# Patient Record
Sex: Female | Born: 1955 | Race: White | Hispanic: No | Marital: Married | Smoking: Current every day smoker
Health system: Southern US, Community
[De-identification: ages and names within clinical notes are randomized; demographics above are authoritative.]

## PROBLEM LIST (undated history)

## (undated) DIAGNOSIS — R51 Headache: Secondary | ICD-10-CM

## (undated) DIAGNOSIS — Z9889 Other specified postprocedural states: Secondary | ICD-10-CM

## (undated) DIAGNOSIS — Z972 Presence of dental prosthetic device (complete) (partial): Secondary | ICD-10-CM

## (undated) DIAGNOSIS — M549 Dorsalgia, unspecified: Secondary | ICD-10-CM

## (undated) DIAGNOSIS — K219 Gastro-esophageal reflux disease without esophagitis: Secondary | ICD-10-CM

## (undated) DIAGNOSIS — R519 Headache, unspecified: Secondary | ICD-10-CM

## (undated) DIAGNOSIS — F329 Major depressive disorder, single episode, unspecified: Secondary | ICD-10-CM

## (undated) DIAGNOSIS — F419 Anxiety disorder, unspecified: Secondary | ICD-10-CM

## (undated) DIAGNOSIS — G8929 Other chronic pain: Secondary | ICD-10-CM

## (undated) DIAGNOSIS — K5909 Other constipation: Secondary | ICD-10-CM

## (undated) DIAGNOSIS — T8859XA Other complications of anesthesia, initial encounter: Secondary | ICD-10-CM

## (undated) DIAGNOSIS — M1990 Unspecified osteoarthritis, unspecified site: Secondary | ICD-10-CM

## (undated) DIAGNOSIS — K449 Diaphragmatic hernia without obstruction or gangrene: Secondary | ICD-10-CM

## (undated) DIAGNOSIS — D4860 Neoplasm of uncertain behavior of unspecified breast: Secondary | ICD-10-CM

## (undated) DIAGNOSIS — T4145XA Adverse effect of unspecified anesthetic, initial encounter: Secondary | ICD-10-CM

## (undated) DIAGNOSIS — K08109 Complete loss of teeth, unspecified cause, unspecified class: Secondary | ICD-10-CM

## (undated) DIAGNOSIS — R920 Mammographic microcalcification found on diagnostic imaging of breast: Secondary | ICD-10-CM

## (undated) DIAGNOSIS — M5136 Other intervertebral disc degeneration, lumbar region: Secondary | ICD-10-CM

## (undated) DIAGNOSIS — K469 Unspecified abdominal hernia without obstruction or gangrene: Secondary | ICD-10-CM

## (undated) DIAGNOSIS — M51369 Other intervertebral disc degeneration, lumbar region without mention of lumbar back pain or lower extremity pain: Secondary | ICD-10-CM

## (undated) DIAGNOSIS — R112 Nausea with vomiting, unspecified: Secondary | ICD-10-CM

## (undated) DIAGNOSIS — M5020 Other cervical disc displacement, unspecified cervical region: Secondary | ICD-10-CM

## (undated) HISTORY — DX: Mammographic microcalcification found on diagnostic imaging of breast: R92.0

## (undated) HISTORY — DX: Dorsalgia, unspecified: M54.9

## (undated) HISTORY — DX: Gastro-esophageal reflux disease without esophagitis: K21.9

## (undated) HISTORY — DX: Other cervical disc displacement, unspecified cervical region: M50.20

## (undated) HISTORY — DX: Other constipation: K59.09

## (undated) HISTORY — DX: Unspecified abdominal hernia without obstruction or gangrene: K46.9

## (undated) HISTORY — DX: Other chronic pain: G89.29

## (undated) HISTORY — DX: Major depressive disorder, single episode, unspecified: F32.9

## (undated) HISTORY — DX: Neoplasm of uncertain behavior of unspecified breast: D48.60

## (undated) HISTORY — PX: BREAST BIOPSY: SHX20

## (undated) HISTORY — DX: Other specified postprocedural states: Z98.890

## (undated) HISTORY — DX: Diaphragmatic hernia without obstruction or gangrene: K44.9

---

## 1991-08-27 HISTORY — PX: SPINE SURGERY: SHX786

## 1998-08-26 HISTORY — PX: ABDOMINAL HYSTERECTOMY: SHX81

## 2001-08-26 HISTORY — PX: WRIST SURGERY: SHX841

## 2002-09-28 ENCOUNTER — Ambulatory Visit (HOSPITAL_COMMUNITY): Admission: RE | Admit: 2002-09-28 | Discharge: 2002-09-28 | Payer: Self-pay | Admitting: Family Medicine

## 2002-09-28 ENCOUNTER — Encounter: Payer: Self-pay | Admitting: Family Medicine

## 2002-10-15 ENCOUNTER — Ambulatory Visit (HOSPITAL_COMMUNITY): Admission: RE | Admit: 2002-10-15 | Discharge: 2002-10-15 | Payer: Self-pay | Admitting: Family Medicine

## 2002-10-15 ENCOUNTER — Encounter: Payer: Self-pay | Admitting: Family Medicine

## 2004-08-26 HISTORY — PX: OTHER SURGICAL HISTORY: SHX169

## 2004-11-20 ENCOUNTER — Ambulatory Visit (HOSPITAL_COMMUNITY): Admission: RE | Admit: 2004-11-20 | Discharge: 2004-11-20 | Payer: Self-pay | Admitting: Orthopedic Surgery

## 2005-06-12 ENCOUNTER — Ambulatory Visit: Payer: Self-pay | Admitting: Unknown Physician Specialty

## 2005-07-22 ENCOUNTER — Ambulatory Visit: Payer: Self-pay

## 2006-06-20 ENCOUNTER — Encounter: Admission: RE | Admit: 2006-06-20 | Discharge: 2006-06-20 | Payer: Self-pay | Admitting: Internal Medicine

## 2006-07-24 ENCOUNTER — Encounter: Admission: RE | Admit: 2006-07-24 | Discharge: 2006-07-24 | Payer: Self-pay | Admitting: Specialist

## 2006-08-26 HISTORY — PX: ELBOW SURGERY: SHX618

## 2006-08-28 ENCOUNTER — Ambulatory Visit: Payer: Self-pay | Admitting: Unknown Physician Specialty

## 2007-06-22 ENCOUNTER — Ambulatory Visit: Payer: Self-pay | Admitting: Internal Medicine

## 2007-06-22 LAB — CONVERTED CEMR LAB
Albumin: 4.2 g/dL (ref 3.5–5.2)
Basophils Absolute: 0 10*3/uL (ref 0.0–0.1)
Chloride: 106 meq/L (ref 96–112)
Eosinophils Relative: 2.8 % (ref 0.0–5.0)
GFR calc Af Amer: 113 mL/min
Lymphocytes Relative: 44.2 % (ref 12.0–46.0)
Platelets: 263 10*3/uL (ref 150–400)
Potassium: 3.5 meq/L (ref 3.5–5.1)
Sodium: 141 meq/L (ref 135–145)
Total Bilirubin: 1 mg/dL (ref 0.3–1.2)
Total Protein: 8 g/dL (ref 6.0–8.3)
WBC: 7.2 10*3/uL (ref 4.5–10.5)

## 2007-06-29 ENCOUNTER — Encounter: Payer: Self-pay | Admitting: Internal Medicine

## 2007-06-29 ENCOUNTER — Ambulatory Visit: Payer: Self-pay | Admitting: Internal Medicine

## 2007-07-10 ENCOUNTER — Ambulatory Visit: Payer: Self-pay | Admitting: Cardiology

## 2007-07-31 ENCOUNTER — Encounter: Admission: RE | Admit: 2007-07-31 | Discharge: 2007-07-31 | Payer: Self-pay | Admitting: Internal Medicine

## 2007-10-07 ENCOUNTER — Ambulatory Visit: Payer: Self-pay | Admitting: Unknown Physician Specialty

## 2007-10-22 DIAGNOSIS — K449 Diaphragmatic hernia without obstruction or gangrene: Secondary | ICD-10-CM | POA: Insufficient documentation

## 2007-10-22 DIAGNOSIS — K219 Gastro-esophageal reflux disease without esophagitis: Secondary | ICD-10-CM

## 2007-10-22 DIAGNOSIS — K5909 Other constipation: Secondary | ICD-10-CM | POA: Insufficient documentation

## 2007-10-22 DIAGNOSIS — M549 Dorsalgia, unspecified: Secondary | ICD-10-CM

## 2007-10-22 DIAGNOSIS — G8929 Other chronic pain: Secondary | ICD-10-CM

## 2007-10-22 DIAGNOSIS — F329 Major depressive disorder, single episode, unspecified: Secondary | ICD-10-CM

## 2007-10-22 DIAGNOSIS — F32A Depression, unspecified: Secondary | ICD-10-CM

## 2007-10-22 HISTORY — DX: Diaphragmatic hernia without obstruction or gangrene: K44.9

## 2007-10-22 HISTORY — DX: Gastro-esophageal reflux disease without esophagitis: K21.9

## 2007-10-22 HISTORY — DX: Other constipation: K59.09

## 2007-10-22 HISTORY — DX: Other chronic pain: G89.29

## 2007-10-22 HISTORY — DX: Depression, unspecified: F32.A

## 2008-04-12 ENCOUNTER — Encounter: Payer: Self-pay | Admitting: Internal Medicine

## 2008-09-01 IMAGING — CT CT CHEST W/ CM
2 of 5 series · 15 of 36 positions shown, 18 images · IV contrast (omnipaque)
Comparison: None.

CLINICAL DATA: Left chest and epigastric pain extending to the back.  Weight loss.  
 CHEST CT WITH CONTRAST:
TECHNIQUE: Multidetector CT imaging of the chest was performed following the standard protocol during bolus administration of intravenous contrast.
 Contrast:  125 cc Omnipaque 300.  Oral contrast was given.
TECHNIQUE: Multidetector CT imaging of the abdomen was performed following the standard protocol during bolus administration of intravenous contrast.

[Series 2: cap 5.0 b40f st · axial · 0.75mm/px · z∈[-425,-80]mm · 12 of 83 slices shown, 15 images]
[im 7/83  mediastinal]
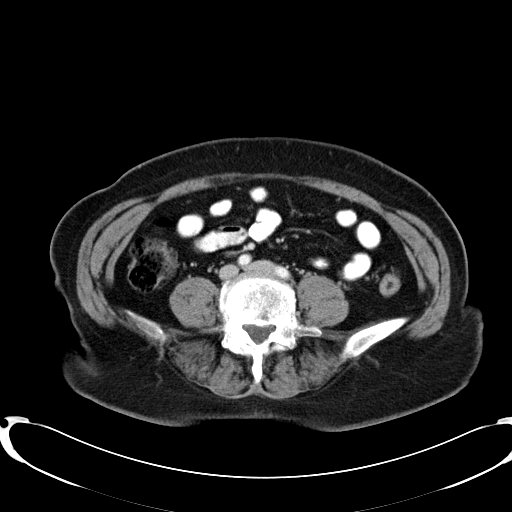
[im 7/83  lung]
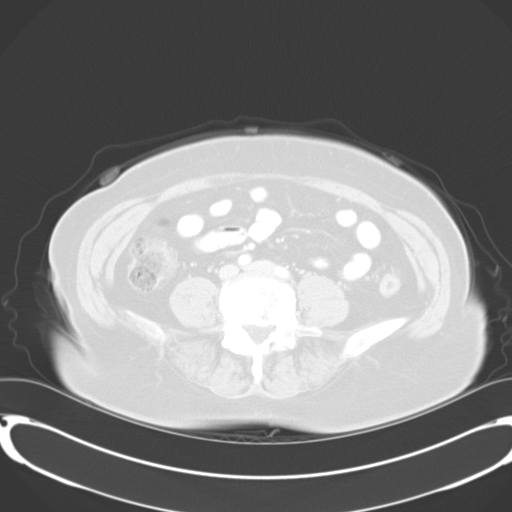
[im 13/83  lung]
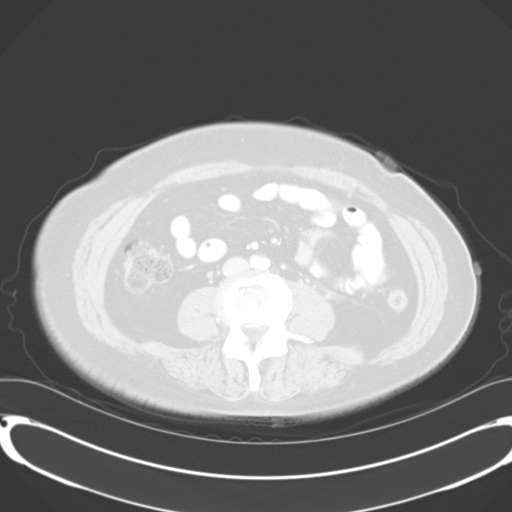
[im 19/83  lung]
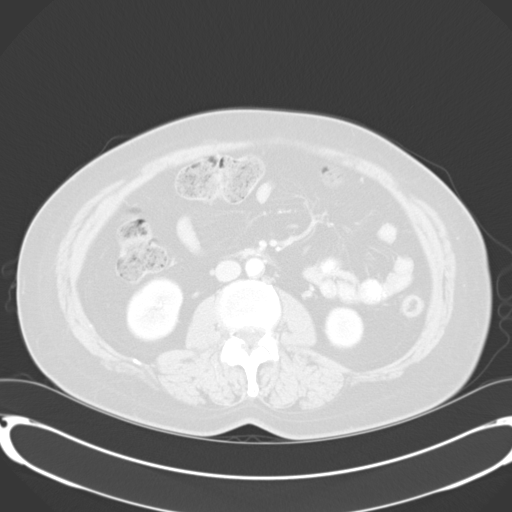
[im 26/83  lung]
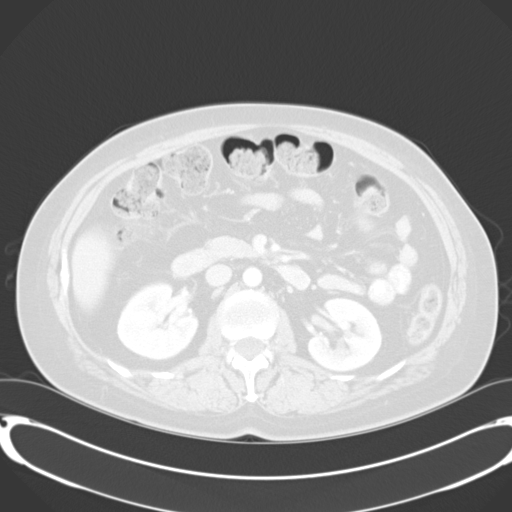
[im 32/83  mediastinal]
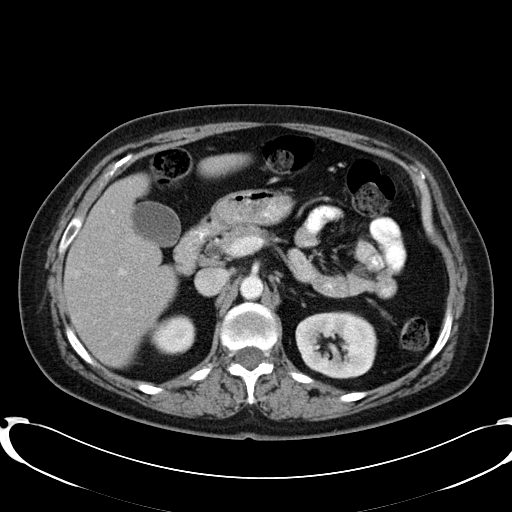
[im 32/83  lung]
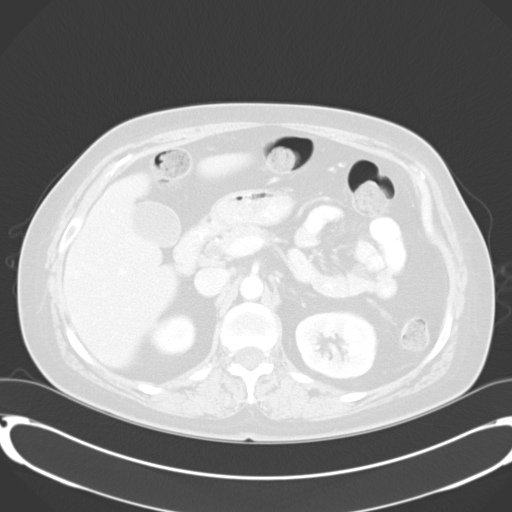
[im 38/83  lung]
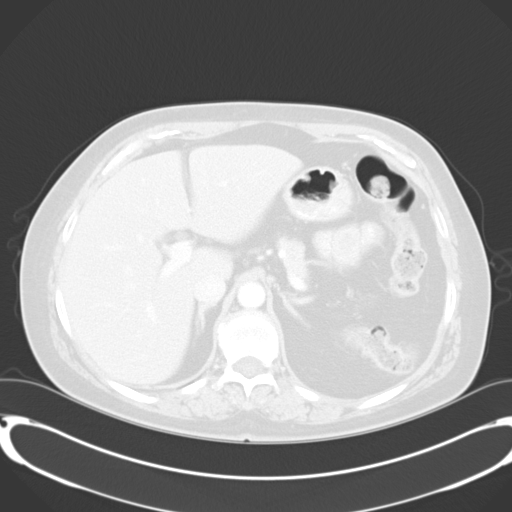
[im 45/83  lung]
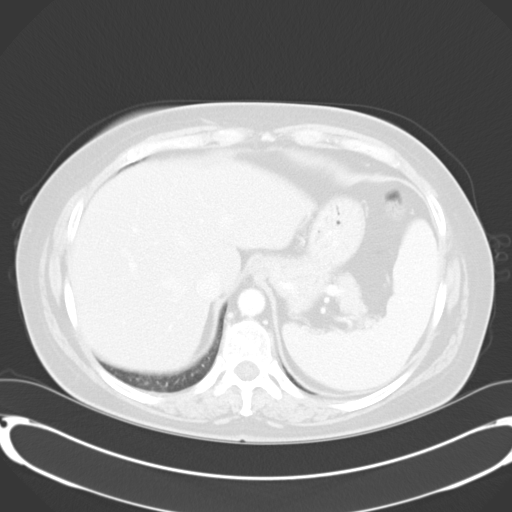
[im 51/83  lung]
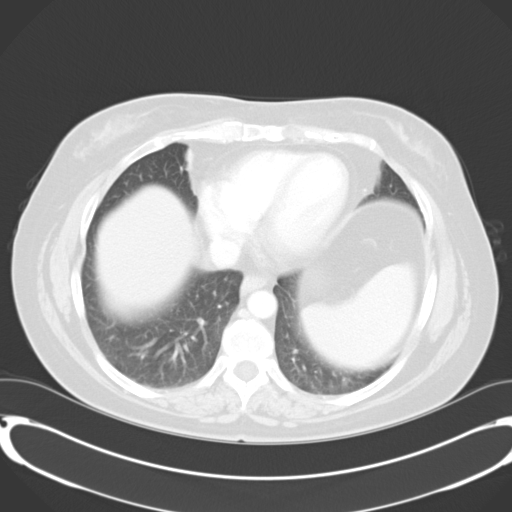
[im 57/83  mediastinal]
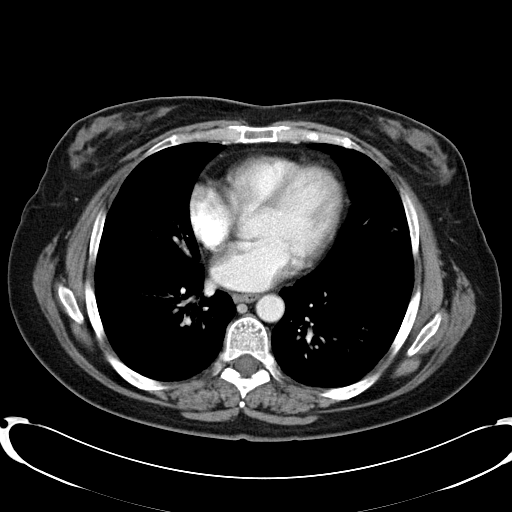
[im 57/83  lung]
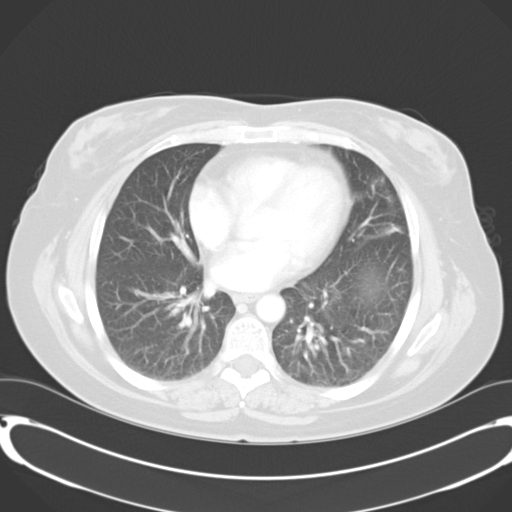
[im 64/83  lung]
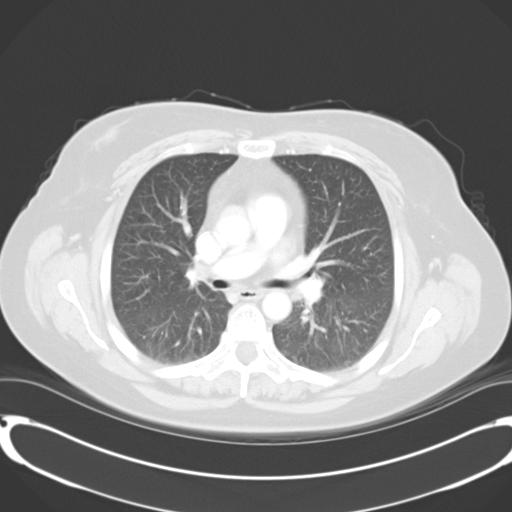
[im 70/83  lung]
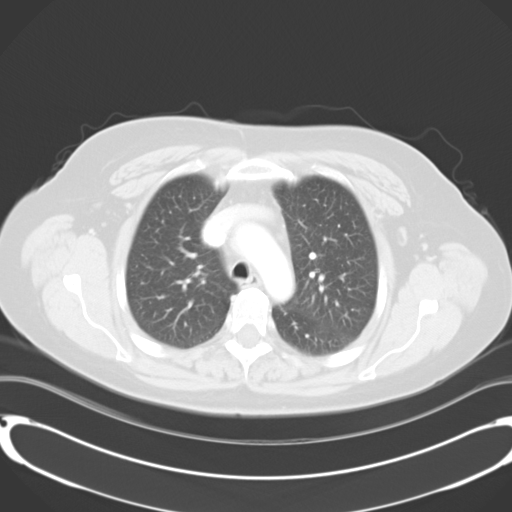
[im 76/83  lung]
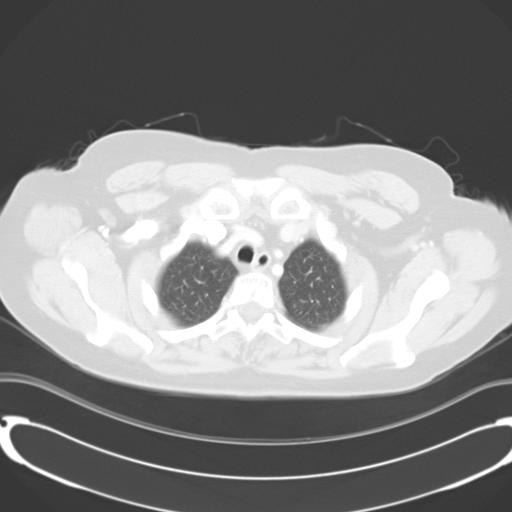

[Series 602: <mpr thick range> · coronal · 0.85mm/px · 3 of 87 slices shown]
[im 18/87  lung]
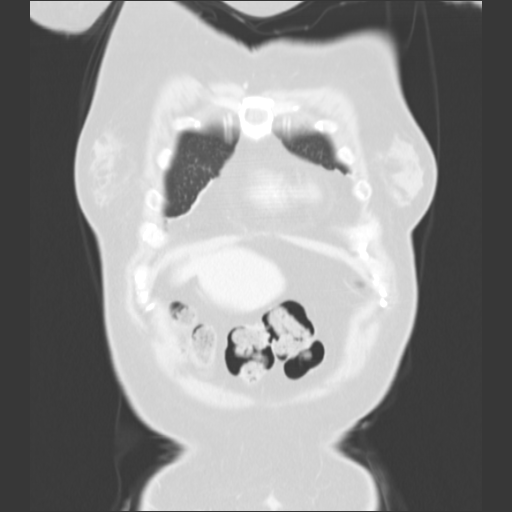
[im 35/87  lung]
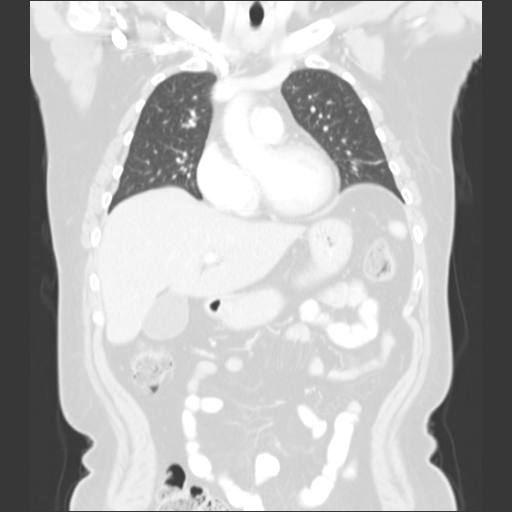
[im 52/87  lung]
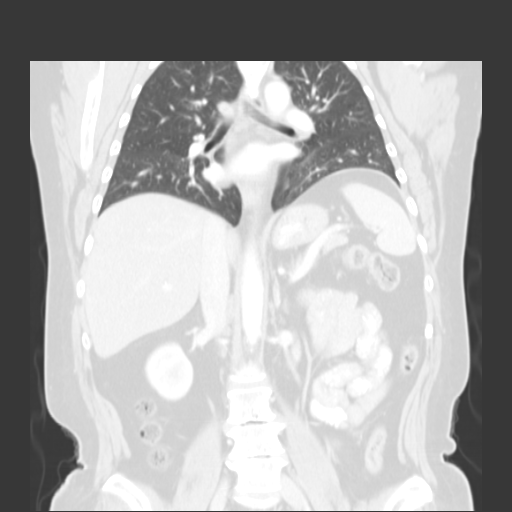

[15 of 36 positions shown; findings below may reference images not displayed]

FINDINGS: There is linear atelectasis or scarring in the lingula.  The lungs are otherwise clear.  The mediastinum appears normal.  No enlarged mediastinal or hilar lymph nodes are present.  There is no pleural or pericardial effusion.  No osseous abnormalities are demonstrated.
IMPRESSION: Negative CT of the chest. 
 ABDOMEN CT WITH CONTRAST:
FINDINGS: There are subcentimeter left renal cysts and a small accessory splenule.  The liver, spleen, gallbladder, pancreas, adrenal glands, and kidneys otherwise appear unremarkable.  There is no lymphadenopathy.  No bowel lesions are identified.  Lower lumbar spine degenerative changes are present with scattered Schmorl's nodes throughout the thoracolumbar spine.
IMPRESSION: No acute or significant abnormality findings.

## 2008-10-11 ENCOUNTER — Ambulatory Visit: Payer: Self-pay | Admitting: Unknown Physician Specialty

## 2008-12-07 ENCOUNTER — Encounter: Payer: Self-pay | Admitting: Internal Medicine

## 2009-03-06 ENCOUNTER — Telehealth: Payer: Self-pay | Admitting: Internal Medicine

## 2009-04-04 ENCOUNTER — Encounter: Payer: Self-pay | Admitting: Orthopedic Surgery

## 2009-04-26 ENCOUNTER — Encounter: Payer: Self-pay | Admitting: Orthopedic Surgery

## 2009-08-03 ENCOUNTER — Ambulatory Visit (HOSPITAL_BASED_OUTPATIENT_CLINIC_OR_DEPARTMENT_OTHER): Admission: RE | Admit: 2009-08-03 | Discharge: 2009-08-03 | Payer: Self-pay | Admitting: Orthopedic Surgery

## 2010-01-02 ENCOUNTER — Ambulatory Visit: Payer: Self-pay | Admitting: Unknown Physician Specialty

## 2011-01-08 NOTE — Assessment & Plan Note (Signed)
[facility]                         GASTROENTEROLOGY OFFICE NOTE   TODD, ARGABRIGHT                         MRN:          [id number]  DATE:06/22/2007                            DOB:          [birth year]    REFERRING PHYSICIAN:  Shayne Mann,   PRIMARY CARE PHYSICIAN:  Jamie Mann, M.D.   PROBLEM:  Painful swallowing and nausea.   HISTORY:  Jamie Mann is a 55 year old white female known to Jamie Mann.  Brodie with a history of gastroesophageal reflux disease.  She was last  seen in the office in 2005, and at that time was on Protonix.  She has  had prior endoscopy done in May 2004.  She was found to have a 3 cm non-  reducible hiatal hernia and grade 2 reflux esophagitis.   The patient says that she has been taking over-the-counter Prilosec  daily over the past couple of years because her insurance stopped paying  for the Protonix and it was going to cost her four-hundred dollars a  month.  She said she has been doing well on the over-the-counter  Prilosec but started about one week ago with fairly abrupt onset of  nausea, dysphagia and odynophagia.  She says that she has been having  some mild intermittent nausea recently but it has been definitely worse  over the week.  She has not had any vomiting.  No fever or chills.  No  diarrhea, no recent antibiotics.  She says it is worse post-prandially  and also sometimes early in the morning before she has eaten anything.  She also relates dysphagia and a feeling as if her food is hanging in  her esophagus.  She says when she swallows, it hurts and she gets a  scraping kind of feeling in her esophagus.  She says that if she tips  her chin forward onto her chest, she has less pain with the swallowing.   She has been on Mobic over the past three months for her chronic back  pain.  She does mention some mild left mid-abdominal discomfort and says  that she has fairly chronic problems with  constipation which she manages  with Correctol.   CURRENT MEDICATIONS:  1. Mobic one p.o. daily.  2. Prilosec OTC one p.o. daily.  3. Avinza (do not have the mg dosage).   ALLERGIES:  No known drug allergies.   PAST MEDICAL/SURGICAL HISTORY:  1. Pertinent for chronic back pain with chronic narcotic use, managed      by Dr. Caralyn Guile. Ramos.  2,  Depression.  1. Status post hysterectomy.   FAMILY HISTORY:  Positive for heart disease in her father.  Ovarian  cancer in her mother.  Maternal grandmother with breast cancer.  Father  also with diabetes.  There is no family history of colon cancer or  polyps.   SOCIAL HISTORY:  The patient is married.  She has one child.  She is  employed in Aeronautical engineer.  She is a smoker.  No ETOH.   REVIEW OF SYSTEMS:  Pertinent only for chronic low back  pain.  Otherwise  complete review of systems negative except for GI as above.   PHYSICAL EXAMINATION:  GENERAL:  A well-developed white female, in no  acute distress.  VITAL SIGNS:  Height 5 feet 2 inches, weight 162 pounds, blood pressure  110/72, pulse 88.  HEENT:  Normocephalic and atraumatic.  EOMI.  Pupils equal, round,  reactive to light and accommodation.  Sclerae anicteric.  CARDIOVASCULAR:  A regular rate and rhythm with S1 and S2.  No murmur,  rub or gallop.  ABDOMEN:  Soft, bowel sounds active.  She is basically nontender.  There  is no palpable mass or hepatosplenomegaly.  RECTAL:  Not done today.   IMPRESSION:  92. A 55 year old female with a one-week history of dysphagia and      odynophagia, associated with  nausea.  Suspect she has a non-steroidal anti-inflammatory disease-  induced esophagitis and gastropathy.  Rule out peptic stricture.  1. Chronic gastroesophageal reflux disease.   PLAN:  1. A trial of Prevacid 30 mg p.o. twice daily x2 weeks.  Then q.a.m.      thereafter.  She was given samples and a prescription today.  She      believes this is on her preferred  list.  2. Schedule upper endoscopy,check cbc and bmet..  3. Would plan an abdominal ultrasound if the endoscopy is unrevealing,      to further evaluate her nausea.  4. Also advised the patient to be n.p.o. for at least two hours before      bedtime and to elevate the head of her bed at home.      Mike Gip, PA-C  Electronically Signed      Jamie Mann. Juanda Chance, Jamie Mann  Electronically Signed   AE/MedQ  DD: 06/22/2007  DT: 06/23/2007  Job #: [id number]

## 2011-01-11 NOTE — Op Note (Signed)
NAMEPREETHI, SCANTLEBURY NO.:  [id number]   MEDICAL RECORD NO.:  [id number]          PATIENT TYPE:  OIB   LOCATION:  2899                         FACILITY:  [facility]   PHYSICIAN:  Deidre Ala, M.D.    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   PROCEDURE:  Right wrist first dorsal compartment DeQuervain's release,  stenosing tenosynovitis release.   SURGEON:  1.  Charlesetta Shanks, M.D.   ASSISTANT:  Clarene Reamer, P.A.-C.   ANESTHESIA:  General with LMA.   CULTURES:  None.   DRAINS:  None.   ESTIMATED BLOOD LOSS:  Minimal.   TOURNIQUET TIME:  20 minutes.   PATHOLOGIC FINDINGS AND HISTORY:  Porter is a 55 year old female who does  repetitive hand use for [employer].  She had a left DeQuervain's syndrome  recently treated successfully with release and neurolysis, and she while  being treated for the left before her surgery had increased pain in the  right.  We did a cortisone injection, and it did not last, so she elected to  proceed with surgery on the right side.  At surgery, she had a very  thickened first dorsal compartment.  She had an abductor pollicis longus  second compartment within the compartment which we also released, with  stenosing tenosynovitis.  We released these fully, with good gliding of  abductor pollicis longus, extensor pollicis brevis.  She had a large  overlying branching radial sensory branch which we carefully predicted and  preserved.   PROCEDURE:  With adequate anesthesia obtained using LMA technique, 1 g Ancef  given IV for prophylaxis, the patient was placed in a supine position.  The  right upper extremity was prepped from the  fingertips to the tourniquet in  the standard fashion.  After standard prepping and draping,  Esmarch  exsanguination was used.  The tourniquet was inflated up to 250 mmHg.  Under  Loupe magnification, a longitudinal incision was then made over the dorsal  compartment.  Incision was deepened sharply with a knife, and hemostasis was  obtained using the Bovie electrocoagulator needlepoint.  Dissection was  carried down to the subcutaneum.  The branching large radial nerve was  encountered and carefully protected and retracted.  I then came down upon  the first dorsal compartment.  It was released initially with a knife, then  scissors, releasing the compartment completely as well as the secondary  compartment of the abductor pollicis longus.  The tendon was then seen to  glide freely.  Irrigation was carried out.  The wound was then closed with a  running subcuticular 3-0 Vicryl and a running 4-0 nylon.  0.5% Marcaine was  injected in and about the wound.  A bulky sterile compressive dressing was  applied, thumb spica dressing, soft, without splint, and the patient, having  tolerating the procedure well, was awakened and taken to the recovery room  in satisfactory condition, to be discharged per outpatient routine, given a  combination of Mepergan Fortis and Percocet for pain, and told to call the  office for a recheck on Friday.      VEP/MEDQ  D:  11/20/2004  T:  11/20/2004  Job:  [id number]   cc:   Deidre Ala, M.D.  [address]  [place], [place] [address]  Fax: [phone]

## 2011-01-29 ENCOUNTER — Ambulatory Visit: Payer: Self-pay | Admitting: Unknown Physician Specialty

## 2012-02-18 ENCOUNTER — Ambulatory Visit: Payer: Self-pay | Admitting: Family Medicine

## 2012-03-16 ENCOUNTER — Ambulatory Visit: Payer: Self-pay | Admitting: General Surgery

## 2012-04-02 ENCOUNTER — Ambulatory Visit: Payer: Self-pay | Admitting: General Surgery

## 2012-04-02 HISTORY — PX: BREAST EXCISIONAL BIOPSY: SUR124

## 2012-04-03 LAB — PATHOLOGY REPORT

## 2012-08-17 ENCOUNTER — Ambulatory Visit: Payer: Self-pay | Admitting: General Surgery

## 2013-02-12 ENCOUNTER — Encounter: Payer: Self-pay | Admitting: General Surgery

## 2013-02-23 ENCOUNTER — Ambulatory Visit: Payer: Self-pay | Admitting: General Surgery

## 2013-03-16 ENCOUNTER — Encounter: Payer: Self-pay | Admitting: *Deleted

## 2013-04-15 ENCOUNTER — Ambulatory Visit: Payer: Self-pay | Admitting: General Surgery

## 2013-04-16 ENCOUNTER — Encounter: Payer: Self-pay | Admitting: General Surgery

## 2013-04-22 ENCOUNTER — Ambulatory Visit (INDEPENDENT_AMBULATORY_CARE_PROVIDER_SITE_OTHER): Payer: 59 | Admitting: General Surgery

## 2013-04-22 ENCOUNTER — Encounter: Payer: Self-pay | Admitting: General Surgery

## 2013-04-22 VITALS — BP 120/70 | HR 64 | Resp 12 | Ht 62.0 in | Wt 198.0 lb

## 2013-04-22 DIAGNOSIS — Z9889 Other specified postprocedural states: Secondary | ICD-10-CM

## 2013-04-22 DIAGNOSIS — Z1239 Encounter for other screening for malignant neoplasm of breast: Secondary | ICD-10-CM

## 2013-04-22 NOTE — Patient Instructions (Addendum)
Continue self breast exams. Call office for any new breast issues or concerns. Annual mammograms with [facility] Return here as needed

## 2013-04-22 NOTE — Progress Notes (Signed)
Patient ID: Jamie Mann, female   DOB: 06/22/1956, 57 y.o.   MRN: [id number]  Chief Complaint  Patient presents with  . Follow-up    mammogram    HPI Jamie Mann is a 57 y.o. female who presents for a breast evaluation. The most recent mammogram was done on 04-15-13.  Patient does perform regular self breast checks and gets regular mammograms done.   No new breast issues.  HPI  Past Medical History  Diagnosis Date  . Hernia   . Neoplasm of uncertain behavior of breast   . Mammographic microcalcification   . GERD (gastroesophageal reflux disease)   . Herniated disc, cervical     Past Surgical History  Procedure Laterality Date  . Elbow surgery  2008  . Disc repair  2006    cervical  . Wrist surgery  2003  . Spine surgery  1993  . Abdominal hysterectomy  2000  . Breast biopsy Right 04-02-2012    lumpectomy, hyperplasia with clear margins    Family History  Problem Relation Age of Onset  . Breast cancer Maternal Grandmother 38    Social History History  Substance Use Topics  . Smoking status: Current Every Day Smoker -- 0.50 packs/day for 38 years    Types: Cigarettes  . Smokeless tobacco: Never Used  . Alcohol Use: No    No Known Allergies  Current Outpatient Prescriptions  Medication Sig Dispense Refill  . ALPRAZolam (XANAX) 0.5 MG tablet Take 1 tablet by mouth daily.      Marland Kitchen atorvastatin (LIPITOR) 40 MG tablet Take 1 tablet by mouth daily.      . citalopram (CELEXA) 40 MG tablet Take 1 tablet by mouth daily.      . lansoprazole (PREVACID) 30 MG capsule Take 30 mg by mouth daily.      . metoprolol (LOPRESSOR) 50 MG tablet Take 1 tablet by mouth daily.      Marland Kitchen oxyCODONE-acetaminophen (PERCOCET) 10-325 MG per tablet Take 1 tablet by mouth every 4 (four) hours as needed for pain.      Marland Kitchen morphine (MS CONTIN) 30 MG 12 hr tablet        No current facility-administered medications for this visit.    Review of Systems Review of Systems   Constitutional: Negative.   Respiratory: Negative.   Cardiovascular: Negative.     Blood pressure 120/70, pulse 64, resp. rate 12, height 5\' 2"  (1.575 m), weight 198 lb (89.812 kg).  Physical Exam Physical Exam  Constitutional: She is oriented to person, place, and time. She appears well-developed and well-nourished.  Eyes: Conjunctivae are normal. No scleral icterus.  Neck: Neck supple.  Cardiovascular: Normal rate and regular rhythm.   Pulmonary/Chest: Effort normal and breath sounds normal. Right breast exhibits no inverted nipple, no mass, no nipple discharge, no skin change and no tenderness. Left breast exhibits no inverted nipple, no mass, no nipple discharge, no skin change and no tenderness.  Lymphadenopathy:    She has no cervical adenopathy.    She has no axillary adenopathy.  Neurological: She is alert and oriented to person, place, and time.  Skin: Skin is warm and dry.    Data Reviewed Mammogram reviewed  Assessment     Stable exam.   Plan    Patient advised to coninue monthly self breast exams       SANKAR,SEEPLAPUTHUR G 04/27/2013, 6:13 AM

## 2013-04-27 ENCOUNTER — Encounter: Payer: Self-pay | Admitting: General Surgery

## 2013-04-27 DIAGNOSIS — Z9889 Other specified postprocedural states: Secondary | ICD-10-CM | POA: Insufficient documentation

## 2013-04-27 DIAGNOSIS — Z1239 Encounter for other screening for malignant neoplasm of breast: Secondary | ICD-10-CM | POA: Insufficient documentation

## 2013-04-27 HISTORY — DX: Other specified postprocedural states: Z98.890

## 2014-06-27 ENCOUNTER — Encounter: Payer: Self-pay | Admitting: General Surgery

## 2014-10-07 ENCOUNTER — Ambulatory Visit: Payer: Self-pay

## 2014-12-13 NOTE — Op Note (Signed)
PATIENT NAME:  Jamie Mann, Jamie Mann MR#:  [id number] DATE OF BIRTH:  02/07/1956  DATE OF PROCEDURE:  04/02/2012  PREOPERATIVE DIAGNOSIS: ADH right breast.   POSTOPERATIVE DIAGNOSIS: ADH right breast.   OPERATION: Right breast lumpectomy.   SURGEON: Mckinley Jewel, MD   ANESTHESIA: Local with a mixture of Marcaine and 1% Xylocaine.   COMPLICATIONS: None.   ESTIMATED BLOOD LOSS: Minimal.   DRAINS: None.   DESCRIPTION OF PROCEDURE: The patient was placed in the supine position on the operating table. The right breast was prepped and draped out as a sterile field. Located just in the superior lateral portion near the areola was the previous biopsy site and there was some minor ecchymosis identified at this site. Ultrasound probe was brought up to the field and the area of shadowing was identified in this region likely representing both the biopsy cavity and the placed clips. This area was marked on the skin and using local anesthetic a small stab incision and a Bard ultra wire were placed going into this area. Following this local anesthetic was instilled and an incision was made along this outer margin 1 cm away from the areola from about the 10 to 12 o'clock location. The incision was deepened through the subcutaneous tissue and the skin was elevated on both sides and an area of what looked like a hemorrhage containing the wire going through was then identified and the glandular tissue surrounding this was circumferentially excised out with cautery. After excision the margins were tagged for inking in the event of any malignancy found within the tissue. This was sent to pathology for the inking process. Inspection of the cavity now showed that there was a mildly irregular spot along the caudal margin region. This was separately excised out and sent off separately also as a new caudal margin. No other abnormalities of the breast tissue was identified. After ensuring hemostasis, the breast gland was  reapproximated with 2-0 Vicryl in two layers and the skin closed with subcuticular 4-0 Vicryl covered with Dermabond. The procedure was well tolerated. She was subsequently returned to the recovery room in stable condition.   ____________________________ S.Robinette Haines, MD sgs:drc D: 04/02/2012 10:54:56 ET T: 04/02/2012 11:16:51 ET JOB#: [id number]  cc: S.G. Jamal Collin, MD, <Dictator> [facility] Robinette Haines MD ELECTRONICALLY SIGNED 04/02/2012 14:11

## 2015-10-23 ENCOUNTER — Encounter: Payer: Self-pay | Admitting: Internal Medicine

## 2015-11-28 ENCOUNTER — Other Ambulatory Visit: Payer: Self-pay | Admitting: Family Medicine

## 2015-11-28 DIAGNOSIS — Z1231 Encounter for screening mammogram for malignant neoplasm of breast: Secondary | ICD-10-CM

## 2015-12-05 ENCOUNTER — Ambulatory Visit
Admission: RE | Admit: 2015-12-05 | Discharge: 2015-12-05 | Disposition: A | Discharge status: Home / Self Care | Payer: 59 | Source: Ambulatory Visit | Attending: Family Medicine | Admitting: Family Medicine

## 2015-12-05 DIAGNOSIS — Z1231 Encounter for screening mammogram for malignant neoplasm of breast: Secondary | ICD-10-CM | POA: Diagnosis not present

## 2016-10-08 ENCOUNTER — Telehealth: Payer: Self-pay | Admitting: Gastroenterology

## 2016-10-08 NOTE — Telephone Encounter (Signed)
error 

## 2016-10-16 ENCOUNTER — Telehealth: Payer: Self-pay | Admitting: Gastroenterology

## 2016-10-16 NOTE — Telephone Encounter (Signed)
Colonoscopy Please feel free to leave a message per patient

## 2016-10-24 ENCOUNTER — Other Ambulatory Visit: Payer: Self-pay

## 2016-10-24 ENCOUNTER — Telehealth: Payer: Self-pay

## 2016-10-24 DIAGNOSIS — Z86010 Personal history of colonic polyps: Secondary | ICD-10-CM

## 2016-10-24 NOTE — Telephone Encounter (Signed)
Gastroenterology Pre-Procedure Review  Request Date: 11/22/16  Requesting Physician: Dr. Allen Norris   PATIENT REVIEW QUESTIONS: The patient responded to the following health history questions as indicated:    1. Are you having any GI issues? no 2. Do you have a personal history of Polyps? yes (polyps) 3. Do you have a family history of Colon Cancer or Polyps? no 4. Diabetes Mellitus? no 5. Joint replacements in the past 12 months?no 6. Major health problems in the past 3 months?no 7. Any artificial heart valves, MVP, or defibrillator?no    MEDICATIONS & ALLERGIES:    Patient reports the following regarding taking any anticoagulation/antiplatelet therapy:   Plavix, Coumadin, Eliquis, Xarelto, Lovenox, Pradaxa, Brilinta, or Effient? no Aspirin? no  Patient confirms/reports the following medications:  Current Outpatient Prescriptions  Medication Sig Dispense Refill  . ALPRAZolam (XANAX) 0.5 MG tablet Take 1 tablet by mouth daily.    Marland Kitchen atorvastatin (LIPITOR) 40 MG tablet Take 1 tablet by mouth daily.    Marland Kitchen atorvastatin (LIPITOR) 80 MG tablet Take 80 mg by mouth daily.  0  . CHANTIX STARTING MONTH PAK 0.5 MG X 11 & 1 MG X 42 tablet See admin instructions. see package  0  . citalopram (CELEXA) 40 MG tablet Take 1 tablet by mouth daily.    Marland Kitchen escitalopram (LEXAPRO) 20 MG tablet Take 20 mg by mouth daily.  0  . lansoprazole (PREVACID) 30 MG capsule Take 30 mg by mouth daily.    . metoprolol (LOPRESSOR) 50 MG tablet Take 1 tablet by mouth daily.    Marland Kitchen morphine (MS CONTIN) 30 MG 12 hr tablet     . oxyCODONE-acetaminophen (PERCOCET) 10-325 MG per tablet Take 1 tablet by mouth every 4 (four) hours as needed for pain.     No current facility-administered medications for this visit.     Patient confirms/reports the following allergies:  No Known Allergies  No orders of the defined types were placed in this encounter.   AUTHORIZATION INFORMATION Primary Insurance: 1D#: Group #:  Secondary  Insurance: 1D#: Group #:  SCHEDULE INFORMATION: Date: 11/22/16 Time: Location: [facility]

## 2016-10-25 ENCOUNTER — Other Ambulatory Visit: Payer: Self-pay

## 2016-10-25 DIAGNOSIS — Z86010 Personal history of colonic polyps: Secondary | ICD-10-CM

## 2016-11-18 ENCOUNTER — Other Ambulatory Visit: Payer: Self-pay

## 2016-11-18 DIAGNOSIS — Z86010 Personal history of colonic polyps: Secondary | ICD-10-CM

## 2016-11-18 MED ORDER — PEG 3350-KCL-NA BICARB-NACL 420 G PO SOLR: 4000.0000 mL | Freq: Once | ORAL | 0 refills | Status: AC

## 2016-11-21 ENCOUNTER — Encounter: Payer: Self-pay | Admitting: Anesthesiology

## 2016-11-22 ENCOUNTER — Ambulatory Visit: Admit: 2016-11-22 | Payer: 59 | Admitting: Gastroenterology

## 2016-11-22 SURGERY — COLONOSCOPY WITH PROPOFOL
Anesthesia: Choice

## 2016-11-25 ENCOUNTER — Other Ambulatory Visit: Payer: Self-pay | Admitting: Family Medicine

## 2016-11-25 ENCOUNTER — Ambulatory Visit: Payer: BLUE CROSS/BLUE SHIELD

## 2016-11-25 ENCOUNTER — Telehealth: Payer: Self-pay | Admitting: Gastroenterology

## 2016-11-25 DIAGNOSIS — R1084 Generalized abdominal pain: Secondary | ICD-10-CM

## 2016-11-25 DIAGNOSIS — R112 Nausea with vomiting, unspecified: Secondary | ICD-10-CM

## 2016-11-25 NOTE — Discharge Instructions (Signed)

## 2016-11-25 NOTE — Telephone Encounter (Signed)
Thank you :)

## 2016-11-25 NOTE — Telephone Encounter (Signed)
Patient called and needs to cx her colonoscopy for this Friday 11/29/16. Her PCP has ordered a gallbladder u/s. She will call back to resched if needed.

## 2016-11-25 NOTE — Telephone Encounter (Signed)
Patient called to cancel her colonoscopy. Her pcp wants her to have an ultrasound for her gallbladder first. She will call back to reschedule. I left a voice message with [facility] to cancel this appointment.

## 2016-11-26 ENCOUNTER — Ambulatory Visit
Admission: RE | Admit: 2016-11-26 | Discharge: 2016-11-26 | Disposition: A | Discharge status: Home / Self Care | Payer: BLUE CROSS/BLUE SHIELD | Source: Ambulatory Visit | Attending: Family Medicine | Admitting: Family Medicine

## 2016-11-26 DIAGNOSIS — R1084 Generalized abdominal pain: Secondary | ICD-10-CM | POA: Diagnosis present

## 2016-11-26 DIAGNOSIS — R112 Nausea with vomiting, unspecified: Secondary | ICD-10-CM

## 2016-11-26 DIAGNOSIS — K8020 Calculus of gallbladder without cholecystitis without obstruction: Secondary | ICD-10-CM | POA: Diagnosis not present

## 2016-11-29 ENCOUNTER — Ambulatory Visit
Admission: RE | Admit: 2016-11-29 | Payer: BLUE CROSS/BLUE SHIELD | Source: Ambulatory Visit | Admitting: Gastroenterology

## 2016-11-29 HISTORY — DX: Unspecified osteoarthritis, unspecified site: M19.90

## 2016-11-29 HISTORY — DX: Other intervertebral disc degeneration, lumbar region without mention of lumbar back pain or lower extremity pain: M51.369

## 2016-11-29 HISTORY — DX: Nausea with vomiting, unspecified: R11.2

## 2016-11-29 HISTORY — DX: Other specified postprocedural states: Z98.890

## 2016-11-29 HISTORY — DX: Other intervertebral disc degeneration, lumbar region: M51.36

## 2016-11-29 HISTORY — DX: Headache, unspecified: R51.9

## 2016-11-29 HISTORY — DX: Complete loss of teeth, unspecified cause, unspecified class: K08.109

## 2016-11-29 HISTORY — DX: Headache: R51

## 2016-11-29 HISTORY — DX: Adverse effect of unspecified anesthetic, initial encounter: T41.45XA

## 2016-11-29 HISTORY — DX: Other complications of anesthesia, initial encounter: T88.59XA

## 2016-11-29 HISTORY — DX: Anxiety disorder, unspecified: F41.9

## 2016-11-29 HISTORY — DX: Presence of dental prosthetic device (complete) (partial): Z97.2

## 2016-11-29 SURGERY — COLONOSCOPY WITH PROPOFOL
Anesthesia: Choice

## 2016-12-03 ENCOUNTER — Other Ambulatory Visit: Payer: Self-pay

## 2016-12-09 ENCOUNTER — Encounter: Payer: Self-pay | Admitting: Surgery

## 2016-12-09 ENCOUNTER — Ambulatory Visit (INDEPENDENT_AMBULATORY_CARE_PROVIDER_SITE_OTHER): Payer: BLUE CROSS/BLUE SHIELD | Admitting: Surgery

## 2016-12-09 ENCOUNTER — Other Ambulatory Visit
Admission: RE | Admit: 2016-12-09 | Discharge: 2016-12-09 | Disposition: A | Discharge status: Home / Self Care | Payer: BLUE CROSS/BLUE SHIELD | Source: Ambulatory Visit | Attending: Surgery | Admitting: Surgery

## 2016-12-09 VITALS — BP 163/83 | HR 97 | Temp 98.5°F | Ht 62.0 in | Wt 199.8 lb

## 2016-12-09 DIAGNOSIS — Z6837 Body mass index (BMI) 37.0-37.9, adult: Secondary | ICD-10-CM | POA: Diagnosis not present

## 2016-12-09 DIAGNOSIS — R1013 Epigastric pain: Secondary | ICD-10-CM

## 2016-12-09 LAB — CBC WITH DIFFERENTIAL/PLATELET
BASOS PCT: 1 %
Basophils Absolute: 0 10*3/uL (ref 0–0.1)
Eosinophils Absolute: 0.2 10*3/uL (ref 0–0.7)
Eosinophils Relative: 2 %
HEMATOCRIT: 36.9 % (ref 35.0–47.0)
HEMOGLOBIN: 13.1 g/dL (ref 12.0–16.0)
LYMPHS ABS: 2.8 10*3/uL (ref 1.0–3.6)
LYMPHS PCT: 37 %
MCH: 31.5 pg (ref 26.0–34.0)
MCHC: 35.5 g/dL (ref 32.0–36.0)
MCV: 88.6 fL (ref 80.0–100.0)
MONOS PCT: 8 %
Monocytes Absolute: 0.6 10*3/uL (ref 0.2–0.9)
NEUTROS ABS: 3.9 10*3/uL (ref 1.4–6.5)
Neutrophils Relative %: 52 %
Platelets: 247 10*3/uL (ref 150–440)
RBC: 4.17 MIL/uL (ref 3.80–5.20)
RDW: 13.5 % (ref 11.5–14.5)
WBC: 7.5 10*3/uL (ref 3.6–11.0)

## 2016-12-09 LAB — COMPREHENSIVE METABOLIC PANEL
ALK PHOS: 107 U/L (ref 38–126)
ALT: 15 U/L (ref 14–54)
ANION GAP: 8 (ref 5–15)
AST: 21 U/L (ref 15–41)
Albumin: 3.8 g/dL (ref 3.5–5.0)
BUN: 7 mg/dL (ref 6–20)
CALCIUM: 9 mg/dL (ref 8.9–10.3)
CHLORIDE: 108 mmol/L (ref 101–111)
CO2: 24 mmol/L (ref 22–32)
Creatinine, Ser: 0.73 mg/dL (ref 0.44–1.00)
GLUCOSE: 131 mg/dL — AB (ref 65–99)
POTASSIUM: 3.6 mmol/L (ref 3.5–5.1)
Sodium: 140 mmol/L (ref 135–145)
Total Bilirubin: 0.5 mg/dL (ref 0.3–1.2)
Total Protein: 7.4 g/dL (ref 6.5–8.1)

## 2016-12-09 NOTE — Patient Instructions (Signed)
We will send you to the [facility] for labs today. I will call with results as soon as they are available.  Please make sure that you follow-up with Dr. Allen Norris in regards to your Colonoscopy and your symptoms.  Call our office with any questions or concerns.

## 2016-12-09 NOTE — Progress Notes (Signed)
Surgical Clinic History and Physical  Referring provider: Lynnell Jude, MD [place], [place] [address]  HISTORY OF PRESENT ILLNESS (HPI):  61 y.o. female presents for evaluation of intermittent daily epigastric abdominal pain x 2 weeks with chronic nausea and diarrhea that she reports normalized yesterday. She states that she first experienced the epigastric abdominal pain after eating an egg and cheese sandwich 2 weeks ago and says that her worst recurrence of the abdominal pain occurred after eating a bacon, egg, and cheese sandwich, but she also says she's eaten many similar foods over the past several weeks without any pain or change in symptoms. Patient otherwise reports she is scheduled to see her gastroenterologist for colonoscopy, and she denies any fever/chills (Tmax 86F 2 weeks ago), CP, or SOB.  PAST MEDICAL HISTORY (PMH):  Past Medical History:  Diagnosis Date  . Anxiety    has had panic attack  . Arthritis    hip/back surgery  . Chronic back pain 10/22/2007   Qualifier: Diagnosis of  By: Bobby Rumpf CMA (AAMA), Patty    . Complication of anesthesia   . CONSTIPATION, CHRONIC 10/22/2007   Qualifier: Diagnosis of  By: Bobby Rumpf CMA (AAMA), Patty    . DDD (degenerative disc disease), lumbar   . Depression 10/22/2007   Qualifier: Diagnosis of  By: Bobby Rumpf CMA (AAMA), Patty    . Full dentures   . GERD 10/22/2007   Qualifier: Diagnosis of  By: Bobby Rumpf CMA (AAMA), Patty    . GERD (gastroesophageal reflux disease)   . Headache    1-2/ month  . Hernia   . Herniated disc, cervical   . Hiatal hernia 10/22/2007   Qualifier: Diagnosis of  By: Bobby Rumpf CMA (AAMA), Patty    . Mammographic microcalcification   . Neoplasm of uncertain behavior of breast   . Personal history of benign breast biopsy 04/27/2013  . PONV (postoperative nausea and vomiting)      PAST SURGICAL HISTORY ([place]):  Past Surgical History:  Procedure Laterality Date  . ABDOMINAL HYSTERECTOMY  2000  . BREAST BIOPSY  Left 10 plus yrs ago   benign  . BREAST EXCISIONAL BIOPSY Right 04-02-2012   lumpectomy, hyperplasia with clear margins  . disc repair  2006   cervical  . ELBOW SURGERY  2008  . [place]   lower back  . WRIST SURGERY  2003     MEDICATIONS:  Prior to Admission medications   Medication Sig Start Date End Date Taking? Authorizing Provider  ALPRAZolam Duanne Moron) 0.5 MG tablet Take 1 tablet by mouth as needed.  04/09/13  Yes Historical Provider, MD  atorvastatin (LIPITOR) 80 MG tablet Take 80 mg by mouth daily. 8pm 08/30/16  Yes Historical Provider, MD  Cholecalciferol (VITAMIN D3) 1000 units CAPS Take by mouth daily. pm   Yes Historical Provider, MD  escitalopram (LEXAPRO) 20 MG tablet Take 1 tablet by mouth 1 day or 1 dose. 11/21/16  Yes Historical Provider, MD  Ferrous Sulfate (IRON) 325 (65 Fe) MG TABS Take by mouth.   Yes Historical Provider, MD  lansoprazole (PREVACID) 30 MG capsule Take 30 mg by mouth 2 (two) times daily before a meal.    Yes Historical Provider, MD  morphine (MS CONTIN) 30 MG 12 hr tablet Take by mouth 3 (three) times daily. Am,lunch,pm 03/18/13  Yes Historical Provider, MD  oxyCODONE-acetaminophen (PERCOCET) 10-325 MG per tablet Take 1 tablet by mouth 3 (three) times daily. Am,lunch,pm   Yes Historical Provider, MD  vitamin  B-12 (CYANOCOBALAMIN) 1000 MCG tablet Take 1,000 mcg by mouth daily. evening   Yes Historical Provider, MD     ALLERGIES:  Allergies  Allergen Reactions  . Hydrocodone Nausea Only  . Propoxyphene Nausea Only     SOCIAL HISTORY:  Social History   Social History  . Marital status: Married    Spouse name: N/A  . Number of children: N/A  . Years of education: N/A   Occupational History  . Not on file.   Social History Main Topics  . Smoking status: Current Every Day Smoker    Packs/day: 0.75    Years: 40.00    Types: Cigarettes  . Smokeless tobacco: Never Used  . Alcohol use No  . Drug use: No  . Sexual activity: Not on file    Other Topics Concern  . Not on file   Social History Narrative  . No narrative on file    The patient currently resides (home / rehab facility / nursing home): Home  The patient normally is (ambulatory / bedbound) : Ambulatory   FAMILY HISTORY:  Family History  Problem Relation Age of Onset  . Ovarian cancer Mother   . Heart disease Father   . Breast cancer Maternal Grandmother 60  . Stroke Paternal Grandmother   . Heart disease Paternal Grandfather     Otherwise negative.  REVIEW OF SYSTEMS:  Constitutional: denies any other weight loss, fever, chills, or sweats  Eyes: denies any other vision changes, history of eye injury  ENT: denies sore throat, hearing problems  Respiratory: denies shortness of breath, wheezing  Cardiovascular: denies chest pain, palpitations  Gastrointestinal: abdominal pain, N/V, and bowel function as per HPI Musculoskeletal: denies any other joint pains or cramps  Skin: Denies any other rashes or skin discolorations  Neurological: denies any other headache, dizziness, weakness  Psychiatric: Denies any other depression, anxiety   All other review of systems were otherwise negative   VITAL SIGNS:  @VSRANGES @     Height: 5\' 2"  (157.5 cm) Weight: 90.6 kg (199 lb 12.8 oz) BMI (Calculated): 36.6   PHYSICAL EXAM:  CConstitutional:  -- Obese body habitus  -- Awake, alert, and oriented x3  Eyes:  -- Pupils equally round and reactive to light  -- No scleral icterus  Ear, nose, throat:  -- No jugular venous distension  Pulmonary:  -- No crackles  -- Equal breath sounds bilaterally -- Breathing non-labored at rest Cardiovascular:  -- S1, S2 present  -- No pericardial rubs  Gastrointestinal:  -- Obese, abdomen soft, minimal epigastric tenderness to palpation, otherwise non-tender (including RUQ), nondistended, no guarding/rebound  -- No abdominal masses appreciated, pulsatile or otherwise  Musculoskeletal and Integumentary:  -- Wounds or skin  discoloration: None -- Extremities: B/L UE and LE FROM, hands and feet warm  Neurologic:  -- Motor function: Intact and symmetric -- Sensation: Intact and symmetric  Labs:  CBC:  Lab Results  Component Value Date   WBC 7.2 06/22/2007   RBC 4.24 06/22/2007   BMP:  Lab Results  Component Value Date   GLUCOSE 80 06/22/2007   CO2 26 06/22/2007   BUN 9 06/22/2007   CREATININE 0.7 06/22/2007   CALCIUM 9.6 06/22/2007     Imaging studies:  Limited RUQ Abdominal Ultrasound (11/26/2016) - personally reviewed and discussed with patient Gallbladder: Small gallstones layer within the gallbladder the largest of 6 mm in diameter. There is no pain over the gallbladder with compression.  Common bile duct: Diameter: The common bile duct  is slightly prominent measuring between 7.6 and 8.9 mm. A distal common bile duct calculus, stricture, or mass cannot be excluded. CT or MRI with MRCP would be recommended.  Liver: The liver parenchyma is echogenic and inhomogeneous consistent with fatty infiltration with some areas of sparing near the gallbladder. No focal hepatic abnormality is seen.  Assessment/Plan:  61 y.o. female with epigastric pain not directly associated with fatty foods, complicated by co-morbidities including morbid obesity (BMI 37), chronic diarrhea, and chronic narcotics ([facility]) for chronic back pain, chronic constipation, GERD with hiatal hernia, and major depression disorder.   - no indication for any surgical intervention at this time   - advised patient to keep appointment and discuss symtpoms with GI  - check CBC, CMP (with LFT's) considering mildly dilated CBD on ultrasound  - recommend heart healthy diet and minimize fatty foods, particularly if becomes associated with symptoms  - recommend weight loss and smoking cessation  - return to clinic as needed  -- Corene Cornea E. Rosana Hoes, MD, [place]: [facility]  for exceptional care. Office: [phone]

## 2016-12-10 ENCOUNTER — Telehealth: Payer: Self-pay

## 2016-12-10 ENCOUNTER — Other Ambulatory Visit: Payer: Self-pay

## 2016-12-10 ENCOUNTER — Telehealth: Payer: Self-pay | Admitting: Gastroenterology

## 2016-12-10 DIAGNOSIS — G8929 Other chronic pain: Secondary | ICD-10-CM

## 2016-12-10 DIAGNOSIS — Z86010 Personal history of colonic polyps: Secondary | ICD-10-CM

## 2016-12-10 DIAGNOSIS — R1013 Epigastric pain: Secondary | ICD-10-CM

## 2016-12-10 NOTE — Telephone Encounter (Signed)
Spoke with patient at this time. Results to lab work given. She was encouraged to follow-up with Dr. Allen Norris as she has been told per PCP and at time of hospital admission. Patient verbalizes understanding and will call today for an appointment.

## 2016-12-10 NOTE — Telephone Encounter (Signed)
Patient left a voice message that she needs to schedule a colonoscopy and EGD. Surgery stated her gallbladder was ok

## 2016-12-16 NOTE — Discharge Instructions (Signed)

## 2016-12-20 ENCOUNTER — Ambulatory Visit: Payer: BLUE CROSS/BLUE SHIELD | Admitting: Anesthesiology

## 2016-12-20 ENCOUNTER — Ambulatory Visit
Admission: RE | Admit: 2016-12-20 | Discharge: 2016-12-20 | Disposition: A | Discharge status: Home / Self Care | Payer: BLUE CROSS/BLUE SHIELD | Source: Ambulatory Visit | Attending: Gastroenterology | Admitting: Gastroenterology

## 2016-12-20 ENCOUNTER — Encounter: Payer: Self-pay | Admitting: Gastroenterology

## 2016-12-20 ENCOUNTER — Encounter
Admission: RE | Disposition: A | Discharge status: Home / Self Care | Payer: Self-pay | Source: Ambulatory Visit | Attending: Gastroenterology

## 2016-12-20 DIAGNOSIS — D123 Benign neoplasm of transverse colon: Secondary | ICD-10-CM | POA: Diagnosis not present

## 2016-12-20 DIAGNOSIS — F410 Panic disorder [episodic paroxysmal anxiety] without agoraphobia: Secondary | ICD-10-CM | POA: Diagnosis not present

## 2016-12-20 DIAGNOSIS — K641 Second degree hemorrhoids: Secondary | ICD-10-CM | POA: Insufficient documentation

## 2016-12-20 DIAGNOSIS — Z860100 Personal history of colon polyps, unspecified: Secondary | ICD-10-CM

## 2016-12-20 DIAGNOSIS — K2950 Unspecified chronic gastritis without bleeding: Secondary | ICD-10-CM | POA: Diagnosis not present

## 2016-12-20 DIAGNOSIS — D125 Benign neoplasm of sigmoid colon: Secondary | ICD-10-CM

## 2016-12-20 DIAGNOSIS — K2970 Gastritis, unspecified, without bleeding: Secondary | ICD-10-CM

## 2016-12-20 DIAGNOSIS — B9681 Helicobacter pylori [H. pylori] as the cause of diseases classified elsewhere: Secondary | ICD-10-CM | POA: Diagnosis not present

## 2016-12-20 DIAGNOSIS — K635 Polyp of colon: Secondary | ICD-10-CM | POA: Diagnosis not present

## 2016-12-20 DIAGNOSIS — Z1211 Encounter for screening for malignant neoplasm of colon: Secondary | ICD-10-CM | POA: Insufficient documentation

## 2016-12-20 DIAGNOSIS — R1013 Epigastric pain: Secondary | ICD-10-CM | POA: Insufficient documentation

## 2016-12-20 DIAGNOSIS — G8929 Other chronic pain: Secondary | ICD-10-CM | POA: Diagnosis not present

## 2016-12-20 DIAGNOSIS — F17210 Nicotine dependence, cigarettes, uncomplicated: Secondary | ICD-10-CM | POA: Diagnosis not present

## 2016-12-20 DIAGNOSIS — D122 Benign neoplasm of ascending colon: Secondary | ICD-10-CM | POA: Insufficient documentation

## 2016-12-20 DIAGNOSIS — K219 Gastro-esophageal reflux disease without esophagitis: Secondary | ICD-10-CM | POA: Diagnosis not present

## 2016-12-20 DIAGNOSIS — F329 Major depressive disorder, single episode, unspecified: Secondary | ICD-10-CM | POA: Insufficient documentation

## 2016-12-20 DIAGNOSIS — M549 Dorsalgia, unspecified: Secondary | ICD-10-CM | POA: Insufficient documentation

## 2016-12-20 DIAGNOSIS — Z79891 Long term (current) use of opiate analgesic: Secondary | ICD-10-CM | POA: Diagnosis not present

## 2016-12-20 DIAGNOSIS — Z79899 Other long term (current) drug therapy: Secondary | ICD-10-CM | POA: Diagnosis not present

## 2016-12-20 DIAGNOSIS — D124 Benign neoplasm of descending colon: Secondary | ICD-10-CM | POA: Diagnosis not present

## 2016-12-20 HISTORY — PX: POLYPECTOMY: SHX5525

## 2016-12-20 HISTORY — PX: ESOPHAGOGASTRODUODENOSCOPY (EGD) WITH PROPOFOL: SHX5813

## 2016-12-20 HISTORY — PX: COLONOSCOPY WITH PROPOFOL: SHX5780

## 2016-12-20 LAB — HM COLONOSCOPY

## 2016-12-20 SURGERY — COLONOSCOPY WITH PROPOFOL
Anesthesia: Monitor Anesthesia Care

## 2016-12-20 MED ORDER — LIDOCAINE HCL (CARDIAC) 20 MG/ML IV SOLN
INTRAVENOUS | Status: DC | PRN
  Administered 2016-12-20: 50 mg via INTRAVENOUS

## 2016-12-20 MED ORDER — STERILE WATER FOR IRRIGATION IR SOLN
Status: DC | PRN
  Administered 2016-12-20: 10:00:00

## 2016-12-20 MED ORDER — ACETAMINOPHEN 325 MG PO TABS: 325.0000 mg | ORAL_TABLET | ORAL | Status: DC | PRN

## 2016-12-20 MED ORDER — GLYCOPYRROLATE 0.2 MG/ML IJ SOLN
INTRAMUSCULAR | Status: DC | PRN
  Administered 2016-12-20: 0.2 mg via INTRAVENOUS

## 2016-12-20 MED ORDER — ACETAMINOPHEN 160 MG/5ML PO SOLN: 325.0000 mg | ORAL | Status: DC | PRN

## 2016-12-20 MED ORDER — LACTATED RINGERS IV SOLN
INTRAVENOUS | Status: DC
  Administered 2016-12-20: 09:00:00 via INTRAVENOUS

## 2016-12-20 MED ORDER — PROPOFOL 10 MG/ML IV BOLUS
INTRAVENOUS | Status: DC | PRN
  Administered 2016-12-20 (×4): 20 mg via INTRAVENOUS
  Administered 2016-12-20: 30 mg via INTRAVENOUS
  Administered 2016-12-20: 20 mg via INTRAVENOUS
  Administered 2016-12-20 (×2): 10 mg via INTRAVENOUS
  Administered 2016-12-20: 80 mg via INTRAVENOUS
  Administered 2016-12-20 (×4): 20 mg via INTRAVENOUS
  Administered 2016-12-20: 30 mg via INTRAVENOUS
  Administered 2016-12-20 (×5): 20 mg via INTRAVENOUS
  Administered 2016-12-20: 30 mg via INTRAVENOUS
  Administered 2016-12-20 (×2): 20 mg via INTRAVENOUS
  Administered 2016-12-20: 10 mg via INTRAVENOUS

## 2016-12-20 SURGICAL SUPPLY — 35 items
BALLN DILATOR 10-12 8 (BALLOONS)
BALLN DILATOR 12-15 8 (BALLOONS)
BALLN DILATOR 15-18 8 (BALLOONS)
BALLN DILATOR CRE 0-12 8 (BALLOONS)
BALLN DILATOR ESOPH 8 10 CRE (MISCELLANEOUS) IMPLANT
BALLOON DILATOR 12-15 8 (BALLOONS) IMPLANT
BALLOON DILATOR 15-18 8 (BALLOONS) IMPLANT
BALLOON DILATOR CRE 0-12 8 (BALLOONS) IMPLANT
BLOCK BITE 60FR ADLT L/F GRN (MISCELLANEOUS) ×3 IMPLANT
CANISTER SUCT 1200ML W/VALVE (MISCELLANEOUS) ×3 IMPLANT
CLIP HMST 235XBRD CATH ROT (MISCELLANEOUS) IMPLANT
CLIP RESOLUTION 360 11X235 (MISCELLANEOUS)
FCP ESCP3.2XJMB 240X2.8X (MISCELLANEOUS)
FORCEPS BIOP RAD 4 LRG CAP 4 (CUTTING FORCEPS) ×2 IMPLANT
FORCEPS BIOP RJ4 240 W/NDL (MISCELLANEOUS)
FORCEPS ESCP3.2XJMB 240X2.8X (MISCELLANEOUS) IMPLANT
GOWN CVR UNV OPN BCK APRN NK (MISCELLANEOUS) ×2 IMPLANT
GOWN ISOL THUMB LOOP REG UNIV (MISCELLANEOUS) ×6
INJECTOR VARIJECT VIN23 (MISCELLANEOUS) IMPLANT
KIT DEFENDO VALVE AND CONN (KITS) IMPLANT
KIT ENDO PROCEDURE OLY (KITS) ×3 IMPLANT
MARKER SPOT ENDO TATTOO 5ML (MISCELLANEOUS) IMPLANT
PAD GROUND ADULT SPLIT (MISCELLANEOUS) ×2 IMPLANT
PROBE APC STR FIRE (PROBE) IMPLANT
RETRIEVER NET PLAT FOOD (MISCELLANEOUS) IMPLANT
RETRIEVER NET ROTH 2.5X230 LF (MISCELLANEOUS) IMPLANT
SNARE SHORT THROW 13M SML OVAL (MISCELLANEOUS) ×2 IMPLANT
SNARE SHORT THROW 30M LRG OVAL (MISCELLANEOUS) IMPLANT
SNARE SNG USE RND 15MM (INSTRUMENTS) IMPLANT
SPOT EX ENDOSCOPIC TATTOO (MISCELLANEOUS)
SYR INFLATION 60ML (SYRINGE) IMPLANT
TRAP ETRAP POLY (MISCELLANEOUS) ×2 IMPLANT
VARIJECT INJECTOR VIN23 (MISCELLANEOUS)
WATER STERILE IRR 250ML POUR (IV SOLUTION) ×3 IMPLANT
WIRE CRE 18-20MM 8CM F G (MISCELLANEOUS) IMPLANT

## 2016-12-20 NOTE — H&P (Signed)
Lucilla Lame, MD [facility] [address], [place], [place] [address] [phone] Fax : [phone]  Primary Care Physician:  Lynnell Jude, MD Primary Gastroenterologist:  Dr. Allen Norris  Pre-Procedure History & Physical: HPI:  Jamie Mann is a 61 y.o. female is here for an endoscopy and colonoscopy.   Past Medical History:  Diagnosis Date  . Anxiety    has had panic attack  . Arthritis    hip/back surgery  . Chronic back pain 10/22/2007   Qualifier: Diagnosis of  By: Bobby Rumpf CMA (AAMA), Patty    . Complication of anesthesia   . CONSTIPATION, CHRONIC 10/22/2007   Qualifier: Diagnosis of  By: Bobby Rumpf CMA (AAMA), Patty    . DDD (degenerative disc disease), lumbar   . Depression 10/22/2007   Qualifier: Diagnosis of  By: Bobby Rumpf CMA (AAMA), Patty    . Full dentures   . GERD 10/22/2007   Qualifier: Diagnosis of  By: Bobby Rumpf CMA (AAMA), Patty    . GERD (gastroesophageal reflux disease)   . Headache    1-2/ month  . Hernia   . Herniated disc, cervical   . Hiatal hernia 10/22/2007   Qualifier: Diagnosis of  By: Bobby Rumpf CMA (AAMA), Patty    . Mammographic microcalcification   . Neoplasm of uncertain behavior of breast   . Personal history of benign breast biopsy 04/27/2013  . PONV (postoperative nausea and vomiting)     Past Surgical History:  Procedure Laterality Date  . ABDOMINAL HYSTERECTOMY  2000  . BREAST BIOPSY Left 10 plus yrs ago   benign  . BREAST EXCISIONAL BIOPSY Right 04-02-2012   lumpectomy, hyperplasia with clear margins  . disc repair  2006   cervical  . ELBOW SURGERY  2008  . [place]   lower back  . WRIST SURGERY  2003    Prior to Admission medications   Medication Sig Start Date End Date Taking? Authorizing Provider  ALPRAZolam Duanne Moron) 0.5 MG tablet Take 1 tablet by mouth as needed.  04/09/13  Yes Historical Provider, MD  atorvastatin (LIPITOR) 80 MG tablet Take 80 mg by mouth daily. 8pm 08/30/16  Yes Historical Provider, MD  Cholecalciferol (VITAMIN  D3) 1000 units CAPS Take by mouth daily. pm   Yes Historical Provider, MD  escitalopram (LEXAPRO) 20 MG tablet Take 1 tablet by mouth 1 day or 1 dose. 11/21/16  Yes Historical Provider, MD  Ferrous Sulfate (IRON) 325 (65 Fe) MG TABS Take by mouth.   Yes Historical Provider, MD  ibuprofen (ADVIL,MOTRIN) 200 MG tablet Take 200 mg by mouth every 6 (six) hours as needed.   Yes Historical Provider, MD  lansoprazole (PREVACID) 30 MG capsule Take 30 mg by mouth 2 (two) times daily before a meal.    Yes Historical Provider, MD  morphine (MS CONTIN) 30 MG 12 hr tablet Take by mouth 3 (three) times daily. Am,lunch,pm 03/18/13  Yes Historical Provider, MD  oxyCODONE-acetaminophen (PERCOCET) 10-325 MG per tablet Take 1 tablet by mouth 3 (three) times daily. Am,lunch,pm   Yes Historical Provider, MD  Potassium 95 MG TABS Take by mouth.   Yes Historical Provider, MD  vitamin B-12 (CYANOCOBALAMIN) 1000 MCG tablet Take 1,000 mcg by mouth daily. evening   Yes Historical Provider, MD    Allergies as of 12/10/2016 - Review Complete 12/09/2016  Allergen Reaction Noted  . Hydrocodone Nausea Only   . Propoxyphene Nausea Only     Family History  Problem Relation Age of Onset  .  Ovarian cancer Mother   . Heart disease Father   . Breast cancer Maternal Grandmother 60  . Stroke Paternal Grandmother   . Heart disease Paternal Grandfather     Social History   Social History  . Marital status: Married    Spouse name: N/A  . Number of children: N/A  . Years of education: N/A   Occupational History  . Not on file.   Social History Main Topics  . Smoking status: Current Every Day Smoker    Packs/day: 0.75    Years: 40.00    Types: Cigarettes  . Smokeless tobacco: Never Used  . Alcohol use No  . Drug use: No  . Sexual activity: Not on file   Other Topics Concern  . Not on file   Social History Narrative  . No narrative on file    Review of Systems: See HPI, otherwise negative ROS  Physical  Exam: BP (!) 144/79   Pulse 96   Temp 97.3 F (36.3 C) (Temporal)   Resp 16   Ht 5\' 2"  (1.575 m)   Wt 195 lb (88.5 kg)   SpO2 96%   BMI 35.67 kg/m  General:   Alert,  pleasant and cooperative in NAD Head:  Normocephalic and atraumatic. Neck:  Supple; no masses or thyromegaly. Lungs:  Clear throughout to auscultation.    Heart:  Regular rate and rhythm. Abdomen:  Soft, nontender and nondistended. Normal bowel sounds, without guarding, and without rebound.   Neurologic:  Alert and  oriented x4;  grossly normal neurologically.  Impression/Plan: Jamie Mann is here for an endoscopy and colonoscopy to be performed for history of colon polyps and epigastric pain  Risks, benefits, limitations, and alternatives regarding  endoscopy and colonoscopy have been reviewed with the patient.  Questions have been answered.  All parties agreeable.   Lucilla Lame, MD  12/20/2016, 9:34 AM

## 2016-12-20 NOTE — Op Note (Signed)
[facility] Gastroenterology Patient Name: Jamie Mann Procedure Date: 12/20/2016 10:16 AM MRN: [id number] Account #: [id number] Date of Birth: 1956-07-08 Admit Type: Outpatient Age: 61 Room: [facility] OR ROOM 01 Gender: Female Note Status: Finalized Procedure:            Colonoscopy Indications:          High risk colon cancer surveillance: Personal history                        of colonic polyps Providers:            Lucilla Lame MD, MD Referring MD:         Lynnell Jude (Referring MD) Medicines:            Propofol per Anesthesia Complications:        No immediate complications. Procedure:            Pre-Anesthesia Assessment:                       - Prior to the procedure, a History and Physical was                        performed, and patient medications and allergies were                        reviewed. The patient's tolerance of previous                        anesthesia was also reviewed. The risks and benefits of                        the procedure and the sedation options and risks were                        discussed with the patient. All questions were                        answered, and informed consent was obtained. Prior                        Anticoagulants: The patient has taken no previous                        anticoagulant or antiplatelet agents. ASA Grade                        Assessment: II - A patient with mild systemic disease.                        After reviewing the risks and benefits, the patient was                        deemed in satisfactory condition to undergo the                        procedure.                       After obtaining informed consent, the colonoscope was  passed under direct vision. Throughout the procedure,                        the patient's blood pressure, pulse, and oxygen                        saturations were monitored continuously. The Olympus                        PCF H180AL  Colonoscope (S#: [id number]) was introduced                        through the anus and advanced to the the cecum,                        identified by appendiceal orifice and ileocecal valve.                        The colonoscopy was performed without difficulty. The                        patient tolerated the procedure well. The quality of                        the bowel preparation was good. Findings:      The perianal and digital rectal examinations were normal.      Three semi-sessile polyps were found in the transverse colon. The polyps       were 3 to 6 mm in size. These polyps were removed with a cold biopsy       forceps. Resection and retrieval were complete.      A 9 mm polyp was found in the transverse colon. The polyp was sessile.       The polyp was removed with a hot snare. Resection and retrieval were       complete.      A 6 mm polyp was found in the ascending colon. The polyp was sessile.       The polyp was removed with a cold biopsy forceps. Resection and       retrieval were complete.      Ten sessile polyps were found in the descending colon. The polyps were 4       to 10 mm in size. These polyps were removed with a hot snare. Resection       and retrieval were complete.      Three sessile polyps were found in the sigmoid colon. The polyps were 5       to 6 mm in size. These polyps were removed with a cold snare. Resection       and retrieval were complete.      Non-bleeding internal hemorrhoids were found during retroflexion. The       hemorrhoids were Grade II (internal hemorrhoids that prolapse but reduce       spontaneously). Impression:           - Three 3 to 6 mm polyps in the transverse colon,                        removed with a cold biopsy forceps. Resected and  retrieved.                       - One 9 mm polyp in the transverse colon, removed with                        a hot snare. Resected and retrieved.                       -  One 6 mm polyp in the ascending colon, removed with a                        cold biopsy forceps. Resected and retrieved.                       - Ten 4 to 10 mm polyps in the descending colon,                        removed with a hot snare. Resected and retrieved.                       - Three 5 to 6 mm polyps in the sigmoid colon, removed                        with a cold snare. Resected and retrieved.                       - Non-bleeding internal hemorrhoids. Recommendation:       - Discharge patient to home.                       - Resume previous diet.                       - Continue present medications.                       - Await pathology results.                       - Repeat colonoscopy in 1 year for surveillance. Procedure Code(s):    --- Professional ---                       [phone], Colonoscopy, flexible; with removal of tumor(s),                        polyp(s), or other lesion(s) by snare technique                       45380, 65, Colonoscopy, flexible; with biopsy, single                        or multiple Diagnosis Code(s):    --- Professional ---                       Z86.010, Personal history of colonic polyps                       D12.3, Benign neoplasm of transverse colon (hepatic  flexure or splenic flexure)                       D12.4, Benign neoplasm of descending colon                       D12.5, Benign neoplasm of sigmoid colon CPT copyright 2016 American Medical Association. All rights reserved. The codes documented in this report are preliminary and upon coder review may  be revised to meet current compliance requirements. Lucilla Lame MD, MD 12/20/2016 11:06:07 AM This report has been signed electronically. Number of Addenda: 0 Note Initiated On: 12/20/2016 10:16 AM Scope Withdrawal Time: 0 hours 31 minutes 24 seconds  Total Procedure Duration: 0 hours 35 minutes 37 seconds       [facility]

## 2016-12-20 NOTE — Op Note (Signed)
[facility] Gastroenterology Patient Name: Jamie Mann Procedure Date: 12/20/2016 10:08 AM MRN: [id number] Account #: [id number] Date of Birth: 08/22/1956 Admit Type: Outpatient Age: 61 Room: [facility] OR ROOM 01 Gender: Female Note Status: Finalized Procedure:            Upper GI endoscopy Indications:          Epigastric abdominal pain Providers:            Lucilla Lame MD, MD Referring MD:         Lynnell Jude (Referring MD) Medicines:            Propofol per Anesthesia Complications:        No immediate complications. Procedure:            Pre-Anesthesia Assessment:                       - Prior to the procedure, a History and Physical was                        performed, and patient medications and allergies were                        reviewed. The patient's tolerance of previous                        anesthesia was also reviewed. The risks and benefits of                        the procedure and the sedation options and risks were                        discussed with the patient. All questions were                        answered, and informed consent was obtained. Prior                        Anticoagulants: The patient has taken no previous                        anticoagulant or antiplatelet agents. ASA Grade                        Assessment: II - A patient with mild systemic disease.                        After reviewing the risks and benefits, the patient was                        deemed in satisfactory condition to undergo the                        procedure.                       After obtaining informed consent, the endoscope was                        passed under direct vision. Throughout the procedure,  the patient's blood pressure, pulse, and oxygen                        saturations were monitored continuously. The Olympus                        GIF H180J Endoscope (F#:[id number]) was introduced through      the mouth, and advanced to the second part of duodenum.                        The upper GI endoscopy was accomplished without                        difficulty. The patient tolerated the procedure well. Findings:      The examined esophagus was normal.      Localized moderate inflammation characterized by erythema was found in       the gastric body and in the gastric antrum. Biopsies were taken with a       cold forceps for histology.      The examined duodenum was normal. Impression:           - Normal esophagus.                       - Gastritis. Biopsied.                       - Normal examined duodenum. Recommendation:       - Await pathology results.                       - Discharge patient to home.                       - Resume previous diet.                       - Avoid NSAID's.                       Stop tobacco use. Procedure Code(s):    --- Professional ---                       [id number], Esophagogastroduodenoscopy, flexible, transoral;                        with biopsy, single or multiple Diagnosis Code(s):    --- Professional ---                       R10.13, Epigastric pain                       K29.70, Gastritis, unspecified, without bleeding CPT copyright 2016 American Medical Association. All rights reserved. The codes documented in this report are preliminary and upon coder review may  be revised to meet current compliance requirements. Lucilla Lame MD, MD 12/20/2016 10:25:10 AM This report has been signed electronically. Number of Addenda: 0 Note Initiated On: 12/20/2016 10:08 AM      [facility]

## 2016-12-20 NOTE — Transfer of Care (Signed)
Immediate Anesthesia Transfer of Care Note  Patient: Jamie Mann  Procedure(s) Performed: Procedure(s): COLONOSCOPY WITH PROPOFOL (N/A) ESOPHAGOGASTRODUODENOSCOPY (EGD) WITH PROPOFOL (N/A) POLYPECTOMY (N/A)  Patient Location: PACU  Anesthesia Type: MAC  Level of Consciousness: awake, alert  and patient cooperative  Airway and Oxygen Therapy: Patient Spontanous Breathing and Patient connected to supplemental oxygen  Post-op Assessment: Post-op Vital signs reviewed, Patient's Cardiovascular Status Stable, Respiratory Function Stable, Patent Airway and No signs of Nausea or vomiting  Post-op Vital Signs: Reviewed and stable  Complications: No apparent anesthesia complications

## 2016-12-20 NOTE — Anesthesia Postprocedure Evaluation (Signed)
Anesthesia Post Note  Patient: Jamie Mann  Procedure(s) Performed: Procedure(s) (LRB): COLONOSCOPY WITH PROPOFOL (N/A) ESOPHAGOGASTRODUODENOSCOPY (EGD) WITH PROPOFOL (N/A) POLYPECTOMY (N/A)  Patient location during evaluation: PACU Anesthesia Type: MAC Level of consciousness: awake and alert Pain management: pain level controlled Vital Signs Assessment: post-procedure vital signs reviewed and stable Respiratory status: spontaneous breathing, nonlabored ventilation, respiratory function stable and patient connected to nasal cannula oxygen Cardiovascular status: stable and blood pressure returned to baseline Anesthetic complications: no    Amaryllis Dyke

## 2016-12-20 NOTE — Anesthesia Procedure Notes (Signed)
Procedure Name: MAC Performed by: Mayme Genta Pre-anesthesia Checklist: Patient identified, Emergency Drugs available, Suction available, Timeout performed and Patient being monitored Patient Re-evaluated:Patient Re-evaluated prior to inductionOxygen Delivery Method: Nasal cannula Placement Confirmation: positive ETCO2

## 2016-12-20 NOTE — Anesthesia Preprocedure Evaluation (Addendum)
Anesthesia Evaluation  Patient identified by MRN, date of birth, ID band Patient awake    History of Anesthesia Complications (+) history of anesthetic complications  Airway Mallampati: II  TM Distance: >3 FB Neck ROM: full    Dental   Pulmonary Current Smoker,    + rhonchi        Cardiovascular Normal cardiovascular exam     Neuro/Psych  Headaches, PSYCHIATRIC DISORDERS  Neuromuscular disease    GI/Hepatic hiatal hernia, GERD  ,  Endo/Other    Renal/GU      Musculoskeletal   Abdominal   Peds  Hematology   Anesthesia Other Findings   Reproductive/Obstetrics                            Anesthesia Physical Anesthesia Plan  ASA: III  Anesthesia Plan: MAC   Post-op Pain Management:    Induction:   Airway Management Planned:   Additional Equipment:   Intra-op Plan:   Post-operative Plan:   Informed Consent: I have reviewed the patients History and Physical, chart, labs and discussed the procedure including the risks, benefits and alternatives for the proposed anesthesia with the patient or authorized representative who has indicated his/her understanding and acceptance.     Plan Discussed with: CRNA  Anesthesia Plan Comments:        Anesthesia Quick Evaluation

## 2016-12-25 ENCOUNTER — Encounter: Payer: Self-pay | Admitting: Gastroenterology

## 2016-12-26 ENCOUNTER — Encounter: Payer: Self-pay | Admitting: Gastroenterology

## 2017-01-03 ENCOUNTER — Other Ambulatory Visit: Payer: Self-pay

## 2017-01-03 ENCOUNTER — Telehealth: Payer: Self-pay | Admitting: Gastroenterology

## 2017-01-03 MED ORDER — CLARITHROMYCIN 500 MG PO TABS: 500.0000 mg | ORAL_TABLET | Freq: Two times a day (BID) | ORAL | 0 refills | Status: AC

## 2017-01-03 MED ORDER — AMOXICILLIN 500 MG PO CAPS: 1000.0000 mg | ORAL_CAPSULE | Freq: Two times a day (BID) | ORAL | 0 refills | Status: AC

## 2017-01-03 MED ORDER — LANSOPRAZOLE 30 MG PO CPDR: 30.0000 mg | DELAYED_RELEASE_CAPSULE | Freq: Two times a day (BID) | ORAL | 3 refills | Status: AC

## 2017-01-03 NOTE — Telephone Encounter (Signed)
Jamie Mann and wants to know why Dr. Allen Norris put her on a antibiotic.

## 2017-01-06 ENCOUNTER — Telehealth: Payer: Self-pay | Admitting: Gastroenterology

## 2017-01-06 NOTE — Telephone Encounter (Signed)
Pt has been informed she has been prescribed 2 antibiotics to treat H-pylori noted on her pathology report. Explained to pt if not treated could cause Peptic Ulcer Disease and Stomach Cancer.  She has been advised to complete prescribed antibiotics and wash hands before and after meals, bathroom, and wash fresh vegetables and fruits she may consume.

## 2017-01-06 NOTE — Telephone Encounter (Signed)
Patient was called in 2 antibiotics and was wondering why. She said Ginger did call her back but didn't leave a message. Please call.

## 2018-01-22 ENCOUNTER — Other Ambulatory Visit: Payer: Self-pay | Admitting: Family Medicine

## 2018-01-22 DIAGNOSIS — N6320 Unspecified lump in the left breast, unspecified quadrant: Secondary | ICD-10-CM

## 2018-01-22 DIAGNOSIS — N644 Mastodynia: Secondary | ICD-10-CM

## 2018-01-30 ENCOUNTER — Ambulatory Visit
Admission: RE | Admit: 2018-01-30 | Discharge: 2018-01-30 | Disposition: A | Discharge status: Home / Self Care | Payer: BLUE CROSS/BLUE SHIELD | Source: Ambulatory Visit | Attending: Family Medicine | Admitting: Family Medicine

## 2018-01-30 DIAGNOSIS — N644 Mastodynia: Secondary | ICD-10-CM

## 2018-01-30 DIAGNOSIS — N6320 Unspecified lump in the left breast, unspecified quadrant: Secondary | ICD-10-CM | POA: Diagnosis present

## 2018-02-11 ENCOUNTER — Ambulatory Visit: Payer: Self-pay | Admitting: Advanced Practice Midwife

## 2021-04-26 ENCOUNTER — Other Ambulatory Visit: Payer: Self-pay | Admitting: Family Medicine

## 2021-04-26 DIAGNOSIS — Z1231 Encounter for screening mammogram for malignant neoplasm of breast: Secondary | ICD-10-CM

## 2021-05-16 ENCOUNTER — Ambulatory Visit
Admission: RE | Admit: 2021-05-16 | Discharge: 2021-05-16 | Disposition: A | Discharge status: Home / Self Care | Payer: Medicare HMO | Source: Ambulatory Visit | Attending: Family Medicine | Admitting: Family Medicine

## 2021-05-16 ENCOUNTER — Other Ambulatory Visit: Payer: Self-pay

## 2021-05-16 DIAGNOSIS — Z1231 Encounter for screening mammogram for malignant neoplasm of breast: Secondary | ICD-10-CM | POA: Diagnosis present

## 2022-04-09 ENCOUNTER — Other Ambulatory Visit: Payer: Self-pay | Admitting: Family Medicine

## 2022-04-09 DIAGNOSIS — Z1231 Encounter for screening mammogram for malignant neoplasm of breast: Secondary | ICD-10-CM

## 2022-05-21 ENCOUNTER — Ambulatory Visit
Admission: RE | Admit: 2022-05-21 | Discharge: 2022-05-21 | Disposition: A | Discharge status: Home / Self Care | Payer: Medicare HMO | Source: Ambulatory Visit | Attending: Family Medicine | Admitting: Family Medicine

## 2022-05-21 DIAGNOSIS — Z1231 Encounter for screening mammogram for malignant neoplasm of breast: Secondary | ICD-10-CM | POA: Diagnosis not present

## 2022-06-04 ENCOUNTER — Other Ambulatory Visit: Payer: Self-pay

## 2022-06-04 ENCOUNTER — Telehealth: Payer: Self-pay

## 2022-06-04 DIAGNOSIS — Z86010 Personal history of colonic polyps: Secondary | ICD-10-CM

## 2022-06-04 MED ORDER — CLENPIQ 10-3.5-12 MG-GM -GM/160ML PO SOLN: 1.0000 | Freq: Once | ORAL | 0 refills | Status: AC

## 2022-06-04 NOTE — Telephone Encounter (Signed)
Gastroenterology Pre-Procedure Review  Request Date: 07/08/22 Requesting Physician: Dr. Allen Norris  PATIENT REVIEW QUESTIONS: The patient responded to the following health history questions as indicated:    1. Are you having any GI issues? Patient stated that Dr. Clemmie Krill informed her that her liver enzymes were elevated but Dr. Clemmie Krill plans on rechecking them in January. 2. Do you have a personal history of Polyps? yes (12/20/16 polyps were noted colonoscopy performed by Dr. Allen Norris) 3. Do you have a family history of Colon Cancer or Polyps?no 4. Diabetes Mellitus? yes (patient takes metformin has been advised to hold 2 days prior) 5. Joint replacements in the past 12 months?no 6. Major health problems in the past 3 months?no 7. Any artificial heart valves, MVP, or defibrillator?no    MEDICATIONS & ALLERGIES:    Patient reports the following regarding taking any anticoagulation/antiplatelet therapy:   Plavix, Coumadin, Eliquis, Xarelto, Lovenox, Pradaxa, Brilinta, or Effient? no Aspirin? no  Patient confirms/reports the following medications:  Current Outpatient Medications  Medication Sig Dispense Refill   ALPRAZolam (XANAX) 0.5 MG tablet Take 1 tablet by mouth as needed.      amoxicillin (AMOXIL) 500 MG capsule Take 2 capsules (1,000 mg total) by mouth 2 (two) times daily. 56 capsule 0   atorvastatin (LIPITOR) 80 MG tablet Take 80 mg by mouth daily. 8pm  0   Cholecalciferol (VITAMIN D3) 1000 units CAPS Take by mouth daily. pm     clarithromycin (BIAXIN) 500 MG tablet Take 1 tablet (500 mg total) by mouth 2 (two) times daily. 28 tablet 0   escitalopram (LEXAPRO) 20 MG tablet Take 1 tablet by mouth 1 day or 1 dose.  0   Ferrous Sulfate (IRON) 325 (65 Fe) MG TABS Take by mouth.     ibuprofen (ADVIL,MOTRIN) 200 MG tablet Take 200 mg by mouth every 6 (six) hours as needed.     lansoprazole (PREVACID) 30 MG capsule Take 1 capsule (30 mg total) by mouth 2 (two) times daily before a meal. 60 capsule 3    morphine (MS CONTIN) 30 MG 12 hr tablet Take by mouth 3 (three) times daily. Am,lunch,pm     oxyCODONE-acetaminophen (PERCOCET) 10-325 MG per tablet Take 1 tablet by mouth 3 (three) times daily. Am,lunch,pm     Potassium 95 MG TABS Take by mouth.     vitamin B-12 (CYANOCOBALAMIN) 1000 MCG tablet Take 1,000 mcg by mouth daily. evening     No current facility-administered medications for this visit.    Patient confirms/reports the following allergies:  Allergies  Allergen Reactions   Hydrocodone Nausea Only   Propoxyphene Nausea Only    No orders of the defined types were placed in this encounter.   AUTHORIZATION INFORMATION Primary Insurance: 1D#: Group #:  Secondary Insurance: 1D#: Group #:  SCHEDULE INFORMATION: Date:  Time: Location:

## 2022-06-27 ENCOUNTER — Telehealth: Payer: Self-pay | Admitting: *Deleted

## 2022-06-27 NOTE — Telephone Encounter (Signed)
Patient called back and stated that she would like to cancel her colonoscopy that is schedule on 07/08/2022 due to told she would have to pay $300 to proceeded. She wants to wait until January 2024. Will send letter.  Called [facility] and cancel the colonoscopy as requested. Spoken to [employer].

## 2022-06-27 NOTE — Telephone Encounter (Signed)
Patient left voicemail on Wed 06/26/2022. I have tried to call patient back and left a voicemail.   Patient stated in the voicemail that she would like to cancel her colonoscopy due to insurance/cost. She would like to reschedule to the beginning of the year.  Waiting for patient to call back.

## 2022-07-08 ENCOUNTER — Encounter: Admission: RE | Payer: Self-pay | Source: Home / Self Care

## 2022-07-08 ENCOUNTER — Ambulatory Visit: Admission: RE | Admit: 2022-07-08 | Payer: Medicare HMO | Source: Home / Self Care | Admitting: Gastroenterology

## 2022-07-08 SURGERY — COLONOSCOPY WITH PROPOFOL
Anesthesia: Choice

## 2023-04-22 ENCOUNTER — Other Ambulatory Visit: Payer: Self-pay | Admitting: Family Medicine

## 2023-04-22 DIAGNOSIS — Z1231 Encounter for screening mammogram for malignant neoplasm of breast: Secondary | ICD-10-CM

## 2023-05-29 ENCOUNTER — Ambulatory Visit
Admission: RE | Admit: 2023-05-29 | Discharge: 2023-05-29 | Disposition: A | Discharge status: Home / Self Care | Payer: Medicare HMO | Source: Ambulatory Visit | Attending: Family Medicine | Admitting: Family Medicine

## 2023-05-29 DIAGNOSIS — Z1231 Encounter for screening mammogram for malignant neoplasm of breast: Secondary | ICD-10-CM | POA: Insufficient documentation
